# Patient Record
Sex: Male | Born: 1995 | Race: White | Hispanic: Yes | Marital: Single | Smoking: Never smoker
Health system: Southern US, Community
[De-identification: ages and names within clinical notes are randomized; demographics above are authoritative.]

## PROBLEM LIST (undated history)

## (undated) DIAGNOSIS — J45909 Unspecified asthma, uncomplicated: Secondary | ICD-10-CM

---

## 2019-09-08 ENCOUNTER — Encounter (HOSPITAL_BASED_OUTPATIENT_CLINIC_OR_DEPARTMENT_OTHER): Payer: Self-pay | Admitting: Emergency Medicine

## 2019-09-08 ENCOUNTER — Emergency Department (HOSPITAL_BASED_OUTPATIENT_CLINIC_OR_DEPARTMENT_OTHER)
Admission: EM | Admit: 2019-09-08 | Discharge: 2019-09-08 | Disposition: A | Discharge status: Home / Self Care | Payer: Worker's Compensation | Attending: Emergency Medicine | Admitting: Emergency Medicine

## 2019-09-08 ENCOUNTER — Other Ambulatory Visit: Payer: Self-pay

## 2019-09-08 ENCOUNTER — Emergency Department (HOSPITAL_BASED_OUTPATIENT_CLINIC_OR_DEPARTMENT_OTHER): Payer: Worker's Compensation

## 2019-09-08 DIAGNOSIS — Y990 Civilian activity done for income or pay: Secondary | ICD-10-CM | POA: Insufficient documentation

## 2019-09-08 DIAGNOSIS — S0101XA Laceration without foreign body of scalp, initial encounter: Secondary | ICD-10-CM | POA: Insufficient documentation

## 2019-09-08 DIAGNOSIS — Y9259 Other trade areas as the place of occurrence of the external cause: Secondary | ICD-10-CM | POA: Diagnosis not present

## 2019-09-08 DIAGNOSIS — Y939 Activity, unspecified: Secondary | ICD-10-CM | POA: Diagnosis not present

## 2019-09-08 DIAGNOSIS — Z23 Encounter for immunization: Secondary | ICD-10-CM | POA: Diagnosis not present

## 2019-09-08 DIAGNOSIS — W228XXA Striking against or struck by other objects, initial encounter: Secondary | ICD-10-CM | POA: Insufficient documentation

## 2019-09-08 DIAGNOSIS — S12601A Unspecified nondisplaced fracture of seventh cervical vertebra, initial encounter for closed fracture: Secondary | ICD-10-CM

## 2019-09-08 DIAGNOSIS — S0990XA Unspecified injury of head, initial encounter: Secondary | ICD-10-CM | POA: Diagnosis present

## 2019-09-08 DIAGNOSIS — F17200 Nicotine dependence, unspecified, uncomplicated: Secondary | ICD-10-CM | POA: Diagnosis not present

## 2019-09-08 MED ORDER — LIDOCAINE-EPINEPHRINE (PF) 2 %-1:200000 IJ SOLN
INTRAMUSCULAR | Status: AC
  Administered 2019-09-08: 10 mL
  Filled 2019-09-08: qty 10

## 2019-09-08 MED ORDER — LIDOCAINE-EPINEPHRINE 2 %-1:100000 IJ SOLN
20.0000 mL | Freq: Once | INTRAMUSCULAR | Status: AC
  Administered 2019-09-08: 20 mL
  Filled 2019-09-08: qty 20

## 2019-09-08 MED ORDER — LIDOCAINE-EPINEPHRINE-TETRACAINE (LET) TOPICAL GEL
3.0000 mL | Freq: Once | TOPICAL | Status: AC
  Administered 2019-09-08: 3 mL via TOPICAL
  Filled 2019-09-08: qty 3

## 2019-09-08 MED ORDER — TETANUS-DIPHTH-ACELL PERTUSSIS 5-2.5-18.5 LF-MCG/0.5 IM SUSP
0.5000 mL | Freq: Once | INTRAMUSCULAR | Status: AC
  Administered 2019-09-08: 0.5 mL via INTRAMUSCULAR
  Filled 2019-09-08: qty 0.5

## 2019-09-08 NOTE — ED Notes (Signed)
ED Provider at bedside. 
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
## 2019-09-08 NOTE — ED Notes (Signed)
PT changed, vitals obtained, wound cleaned, suture cart at bedside.
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
## 2019-09-18 ENCOUNTER — Encounter (HOSPITAL_BASED_OUTPATIENT_CLINIC_OR_DEPARTMENT_OTHER): Payer: Self-pay

## 2019-09-18 ENCOUNTER — Other Ambulatory Visit: Payer: Self-pay

## 2019-09-18 ENCOUNTER — Emergency Department (HOSPITAL_BASED_OUTPATIENT_CLINIC_OR_DEPARTMENT_OTHER)
Admission: EM | Admit: 2019-09-18 | Discharge: 2019-09-18 | Disposition: A | Discharge status: Home / Self Care | Payer: Worker's Compensation | Attending: Emergency Medicine | Admitting: Emergency Medicine

## 2019-09-18 DIAGNOSIS — Z4802 Encounter for removal of sutures: Secondary | ICD-10-CM | POA: Insufficient documentation

## 2019-09-18 NOTE — ED Triage Notes (Signed)
Pt for staple removal to scalp-NAD-steady gait-pt wearing aspen collar-steady gait
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
## 2019-09-18 NOTE — ED Notes (Signed)
4 staples removed, pt tolerated well.

## 2020-05-02 ENCOUNTER — Emergency Department (HOSPITAL_COMMUNITY)
Admission: EM | Admit: 2020-05-02 | Discharge: 2020-05-02 | Disposition: A | Discharge status: Home / Self Care | Payer: BC Managed Care – PPO | Attending: Emergency Medicine | Admitting: Emergency Medicine

## 2020-05-02 ENCOUNTER — Encounter (HOSPITAL_COMMUNITY): Payer: Self-pay

## 2020-05-02 ENCOUNTER — Other Ambulatory Visit: Payer: Self-pay

## 2020-05-02 DIAGNOSIS — Z79899 Other long term (current) drug therapy: Secondary | ICD-10-CM | POA: Insufficient documentation

## 2020-05-02 DIAGNOSIS — R0602 Shortness of breath: Secondary | ICD-10-CM | POA: Insufficient documentation

## 2020-05-02 DIAGNOSIS — R0982 Postnasal drip: Secondary | ICD-10-CM

## 2020-05-02 DIAGNOSIS — J45909 Unspecified asthma, uncomplicated: Secondary | ICD-10-CM | POA: Diagnosis present

## 2020-05-02 HISTORY — DX: Unspecified asthma, uncomplicated: J45.909

## 2020-05-02 MED ORDER — LORATADINE 10 MG PO TABS
10.0000 mg | ORAL_TABLET | Freq: Once | ORAL | Status: AC
  Administered 2020-05-02: 10 mg via ORAL
  Filled 2020-05-02: qty 1

## 2020-05-02 MED ORDER — LORATADINE 10 MG PO TABS: 10.0000 mg | ORAL_TABLET | Freq: Every day | ORAL | 0 refills | Status: AC

## 2020-05-02 MED ORDER — ALBUTEROL SULFATE HFA 108 (90 BASE) MCG/ACT IN AERS
2.0000 | INHALATION_SPRAY | Freq: Once | RESPIRATORY_TRACT | Status: AC
  Administered 2020-05-02: 2 via RESPIRATORY_TRACT
  Filled 2020-05-02: qty 6.7

## 2020-05-02 NOTE — ED Provider Notes (Signed)
[facility]-EMERGENCY DEPT Provider Note   CSN: [id number] Arrival date & time: 05/02/20  0244     History Chief Complaint  Patient presents with  . Asthma    Seth Crawford Levon Hedger is a 24 y.o. male.   Asthma This is a chronic problem. The problem occurs daily. The problem has not changed since onset.Pertinent negatives include no chest pain, no abdominal pain, no headaches and no shortness of breath. Nothing aggravates the symptoms. Nothing relieves the symptoms. He has tried nothing for the symptoms. The treatment provided no relief.       Past Medical History:  Diagnosis Date  . Asthma     There are no problems to display for this patient.   No past surgical history on file.     No family history on file.  Social History   Tobacco Use  . Smoking status: Never Smoker  . Smokeless tobacco: Never Used  Vaping Use  . Vaping Use: Every day  Substance Use Topics  . Alcohol use: Not Currently  . Drug use: Not Currently    Home Medications Prior to Admission medications   Medication Sig Start Date End Date Taking? Authorizing Provider  loratadine (CLARITIN) 10 MG tablet Take 1 tablet (10 mg total) by mouth daily. One po daily x 5 days 05/02/20   Makaleigh Reinard, Barbara Cower, MD    Allergies    Patient has no known allergies.  Review of Systems   Review of Systems  Respiratory: Negative for shortness of breath.   Cardiovascular: Negative for chest pain.  Gastrointestinal: Negative for abdominal pain.  Neurological: Negative for headaches.  All other systems reviewed and are negative.   Physical Exam Updated Vital Signs BP 129/72   Pulse (!) 55   Temp 97.8 F (36.6 C) (Oral)   Resp 20   SpO2 95%   Physical Exam Vitals and nursing note reviewed.  Constitutional:      Appearance: He is well-developed.  HENT:     Head: Normocephalic and atraumatic.     Nose: Nose normal.     Mouth/Throat:     Mouth: Mucous membranes are moist.      Pharynx: Oropharynx is clear. No oropharyngeal exudate.  Eyes:     Conjunctiva/sclera: Conjunctivae normal.     Pupils: Pupils are equal, round, and reactive to light.  Cardiovascular:     Rate and Rhythm: Normal rate.  Pulmonary:     Effort: Pulmonary effort is normal. No respiratory distress.  Abdominal:     General: Abdomen is flat. There is no distension.  Musculoskeletal:        General: No swelling or tenderness. Normal range of motion.     Cervical back: Normal range of motion.  Skin:    General: Skin is warm and dry.  Neurological:     General: No focal deficit present.     Mental Status: He is alert.     ED Results / Procedures / Treatments   Labs (all labs ordered are listed, but only abnormal results are displayed) Labs Reviewed - No data to display  EKG None  Radiology No results found.  Procedures Procedures (including critical care time)  Medications Ordered in ED Medications  albuterol (VENTOLIN HFA) 108 (90 Base) MCG/ACT inhaler 2 puff (2 puffs Inhalation Given 05/02/20 0532)  loratadine (CLARITIN) tablet 10 mg (10 mg Oral Given 05/02/20 0532)    ED Course  I have reviewed the triage vital signs and the nursing notes.  Pertinent  labs & imaging results that were available during my care of the patient were reviewed by me and considered in my medical decision making (see chart for details).    MDM Rules/Calculators/A&P                          Lungs clear. Also has PND, which is likely the actual issue. No indication for further workup. Inhaler given per request.   Final Clinical Impression(s) / ED Diagnoses Final diagnoses:  Shortness of breath  PND (post-nasal drip)    Rx / DC Orders ED Discharge Orders         Ordered    loratadine (CLARITIN) 10 MG tablet  Daily     Discontinue  Reprint     05/02/20 0503           Mariha Sleeper, Barbara Cower, MD 05/02/20 [phone]

## 2020-05-02 NOTE — ED Triage Notes (Signed)
Patient arrived stating that he had an asthma attack roughly 30 minutes ago. Patient states he has had some shortness of breath yesterday. States he is feeling better now but still having some mild shortness of breath. Patient in no distress in triage.

## 2020-09-12 ENCOUNTER — Other Ambulatory Visit: Payer: Self-pay

## 2020-09-12 ENCOUNTER — Ambulatory Visit (HOSPITAL_COMMUNITY)
Admission: RE | Admit: 2020-09-12 | Discharge: 2020-09-12 | Disposition: A | Discharge status: Home / Self Care | Payer: BC Managed Care – PPO | Source: Ambulatory Visit | Attending: Family Medicine | Admitting: Family Medicine

## 2020-09-12 ENCOUNTER — Encounter (HOSPITAL_COMMUNITY): Payer: Self-pay

## 2020-09-12 VITALS — BP 122/73 | HR 57 | Temp 98.2°F | Resp 18

## 2020-09-12 DIAGNOSIS — J4521 Mild intermittent asthma with (acute) exacerbation: Secondary | ICD-10-CM

## 2020-09-12 MED ORDER — ALBUTEROL SULFATE HFA 108 (90 BASE) MCG/ACT IN AERS: 1.0000 | INHALATION_SPRAY | Freq: Four times a day (QID) | RESPIRATORY_TRACT | 2 refills | Status: AC | PRN

## 2020-09-12 NOTE — ED Triage Notes (Signed)
Pt presents for medication refill albuterol inhaler.  Pt reports for the past 7 months cough when he get home, states where he lives mildew, vents smells like gasoline .

## 2020-09-12 NOTE — ED Provider Notes (Signed)
[facility]    CSN: [id number] Arrival date & time: 09/12/20  1755      History   Chief Complaint Chief Complaint  Patient presents with  . Appointment    1800  . Medication Refill    HPI Seth Crawford is a 24 y.o. male.   Presenting today requesting albuterol inhaler refill. States he has a long hx of mild intermittent asthma and since moving into his apartment 9 months ago this has been exacerbated when he's home for long periods of time. He feels there is mold in the vents that is causing issues. Does very well with prn albuterol use but has run out of his inhaler. Does not use every day. Denies any other sxs at this time aside from occasional wheezing and chest tightness at home.      Past Medical History:  Diagnosis Date  . Asthma     There are no problems to display for this patient.   History reviewed. No pertinent surgical history.     Home Medications    Prior to Admission medications   Medication Sig Start Date End Date Taking? Authorizing Provider  albuterol (VENTOLIN HFA) 108 (90 Base) MCG/ACT inhaler Inhale 1-2 puffs into the lungs every 6 (six) hours as needed for wheezing or shortness of breath. 09/12/20   Particia Nearing, PA-C  loratadine (CLARITIN) 10 MG tablet Take 1 tablet (10 mg total) by mouth daily. One po daily x 5 days 05/02/20   Mesner, Barbara Cower, MD    Family History History reviewed. No pertinent family history.  Social History Social History   Tobacco Use  . Smoking status: Never Smoker  . Smokeless tobacco: Never Used  Vaping Use  . Vaping Use: Every day  Substance Use Topics  . Alcohol use: Not Currently  . Drug use: Not Currently     Allergies   Patient has no known allergies.   Review of Systems Review of Systems PER HPI   Physical Exam Triage Vital Signs ED Triage Vitals  Enc Vitals Group     BP 09/12/20 1818 122/73     Pulse Rate 09/12/20 1818 (!) 57     Resp 09/12/20 1818 18      Temp 09/12/20 1818 98.2 F (36.8 C)     Temp Source 09/12/20 1818 Oral     SpO2 09/12/20 1818 98 %     Weight --      Height --      Head Circumference --      Peak Flow --      Pain Score 09/12/20 1817 0     Pain Loc --      Pain Edu? --      Excl. in GC? --    No data found.  Updated Vital Signs BP 122/73 (BP Location: Right Arm)   Pulse (!) 57   Temp 98.2 F (36.8 C) (Oral)   Resp 18   SpO2 98%   Visual Acuity Right Eye Distance:   Left Eye Distance:   Bilateral Distance:    Right Eye Near:   Left Eye Near:    Bilateral Near:     Physical Exam Vitals and nursing note reviewed.  Constitutional:      Appearance: Normal appearance.  HENT:     Head: Atraumatic.     Right Ear: Tympanic membrane normal.     Left Ear: Tympanic membrane normal.     Nose: Nose normal.     Mouth/Throat:  Mouth: Mucous membranes are moist.     Pharynx: Oropharynx is clear.  Eyes:     Extraocular Movements: Extraocular movements intact.     Conjunctiva/sclera: Conjunctivae normal.  Cardiovascular:     Rate and Rhythm: Normal rate and regular rhythm.  Pulmonary:     Effort: Pulmonary effort is normal.     Breath sounds: Normal breath sounds. No wheezing or rales.  Abdominal:     General: Bowel sounds are normal. There is no distension.     Palpations: Abdomen is soft.     Tenderness: There is no abdominal tenderness. There is no guarding.  Musculoskeletal:        General: Normal range of motion.     Cervical back: Normal range of motion and neck supple.  Skin:    General: Skin is warm and dry.  Neurological:     General: No focal deficit present.     Mental Status: He is oriented to person, place, and time.  Psychiatric:        Mood and Affect: Mood normal.        Thought Content: Thought content normal.        Judgment: Judgment normal.      UC Treatments / Results  Labs (all labs ordered are listed, but only abnormal results are displayed) Labs Reviewed - No data  to display  EKG   Radiology No results found.  Procedures Procedures (including critical care time)  Medications Ordered in UC Medications - No data to display  Initial Impression / Assessment and Plan / UC Course  I have reviewed the triage vital signs and the nursing notes.  Pertinent labs & imaging results that were available during my care of the patient were reviewed by me and considered in my medical decision making (see chart for details).     Will refill albuterol for prn use, discussed continued antihistamine regimen and working toward purifying air in living space. F/u for worsening sxs   Final Clinical Impressions(s) / UC Diagnoses   Final diagnoses:  Mild intermittent asthma with acute exacerbation   Discharge Instructions   None    ED Prescriptions    Medication Sig Dispense Auth. Provider   albuterol (VENTOLIN HFA) 108 (90 Base) MCG/ACT inhaler Inhale 1-2 puffs into the lungs every 6 (six) hours as needed for wheezing or shortness of breath. 18 g Particia Nearing, [place]     PDMP not reviewed this encounter.   Particia Nearing, [place] 09/12/20 1836
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
